# Patient Record
Sex: Male | Born: 1975 | Race: White | Hispanic: No | Marital: Single | Smoking: Current every day smoker
Health system: Southern US, Community
[De-identification: ages and names within clinical notes are randomized; demographics above are authoritative.]

## PROBLEM LIST (undated history)

## (undated) DIAGNOSIS — N200 Calculus of kidney: Secondary | ICD-10-CM

## (undated) HISTORY — PX: CHOLECYSTECTOMY: SHX55

---

## 2016-11-04 ENCOUNTER — Encounter (HOSPITAL_BASED_OUTPATIENT_CLINIC_OR_DEPARTMENT_OTHER): Payer: Self-pay

## 2016-11-04 ENCOUNTER — Emergency Department (HOSPITAL_BASED_OUTPATIENT_CLINIC_OR_DEPARTMENT_OTHER): Payer: Self-pay

## 2016-11-04 ENCOUNTER — Emergency Department (HOSPITAL_BASED_OUTPATIENT_CLINIC_OR_DEPARTMENT_OTHER)
Admission: EM | Admit: 2016-11-04 | Discharge: 2016-11-05 | Disposition: A | Discharge status: Home / Self Care | Payer: Self-pay | Attending: Emergency Medicine | Admitting: Emergency Medicine

## 2016-11-04 DIAGNOSIS — N200 Calculus of kidney: Secondary | ICD-10-CM | POA: Insufficient documentation

## 2016-11-04 DIAGNOSIS — F17210 Nicotine dependence, cigarettes, uncomplicated: Secondary | ICD-10-CM | POA: Insufficient documentation

## 2016-11-04 HISTORY — DX: Calculus of kidney: N20.0

## 2016-11-04 LAB — URINALYSIS, ROUTINE W REFLEX MICROSCOPIC
Bilirubin Urine: NEGATIVE
GLUCOSE, UA: NEGATIVE mg/dL
KETONES UR: NEGATIVE mg/dL
NITRITE: NEGATIVE
PROTEIN: NEGATIVE mg/dL
Specific Gravity, Urine: 1.02 (ref 1.005–1.030)
pH: 6 (ref 5.0–8.0)

## 2016-11-04 LAB — CBC WITH DIFFERENTIAL/PLATELET
BASOS ABS: 0 10*3/uL (ref 0.0–0.1)
BASOS PCT: 0 %
EOS ABS: 0.2 10*3/uL (ref 0.0–0.7)
EOS PCT: 2 %
HEMATOCRIT: 45.9 % (ref 39.0–52.0)
Hemoglobin: 15.7 g/dL (ref 13.0–17.0)
Lymphocytes Relative: 15 %
Lymphs Abs: 1.7 10*3/uL (ref 0.7–4.0)
MCH: 29.1 pg (ref 26.0–34.0)
MCHC: 34.2 g/dL (ref 30.0–36.0)
MCV: 85.2 fL (ref 78.0–100.0)
MONO ABS: 0.9 10*3/uL (ref 0.1–1.0)
MONOS PCT: 8 %
NEUTROS ABS: 8.5 10*3/uL — AB (ref 1.7–7.7)
Neutrophils Relative %: 75 %
PLATELETS: 231 10*3/uL (ref 150–400)
RBC: 5.39 MIL/uL (ref 4.22–5.81)
RDW: 13.7 % (ref 11.5–15.5)
WBC: 11.4 10*3/uL — ABNORMAL HIGH (ref 4.0–10.5)

## 2016-11-04 LAB — URINALYSIS, MICROSCOPIC (REFLEX)

## 2016-11-04 LAB — BASIC METABOLIC PANEL
ANION GAP: 7 (ref 5–15)
BUN: 14 mg/dL (ref 6–20)
CALCIUM: 9 mg/dL (ref 8.9–10.3)
CO2: 25 mmol/L (ref 22–32)
CREATININE: 1.09 mg/dL (ref 0.61–1.24)
Chloride: 106 mmol/L (ref 101–111)
GFR calc Af Amer: >60 mL/min (ref >60)
GLUCOSE: 121 mg/dL — AB (ref 65–99)
Potassium: 4.1 mmol/L (ref 3.5–5.1)
Sodium: 138 mmol/L (ref 135–145)

## 2016-11-04 MED ORDER — KETOROLAC TROMETHAMINE 30 MG/ML IJ SOLN
30.0000 mg | Freq: Once | INTRAMUSCULAR | Status: AC
  Administered 2016-11-04: 30 mg via INTRAVENOUS
  Filled 2016-11-04: qty 1

## 2016-11-04 MED ORDER — SODIUM CHLORIDE 0.9 % IV BOLUS (SEPSIS)
500.0000 mL | Freq: Once | INTRAVENOUS | Status: AC
  Administered 2016-11-04: 500 mL via INTRAVENOUS

## 2016-11-04 MED ORDER — HYDROMORPHONE HCL 1 MG/ML IJ SOLN
1.0000 mg | Freq: Once | INTRAMUSCULAR | Status: AC
  Administered 2016-11-04: 1 mg via INTRAVENOUS
  Filled 2016-11-04: qty 1

## 2016-11-04 MED ORDER — ONDANSETRON HCL 4 MG/2ML IJ SOLN
4.0000 mg | Freq: Once | INTRAMUSCULAR | Status: DC
  Filled 2016-11-04: qty 2

## 2016-11-04 NOTE — ED Triage Notes (Signed)
C/o left flank pain x 2 hours-states feels like kidney stone-grimacing-pacing

## 2016-11-04 NOTE — ED Provider Notes (Signed)
[facility]   CSN: [id number] Arrival date & time: 11/04/16  2139     History   Chief Complaint Chief Complaint  Patient presents with  . Flank Pain    HPI Shannon Simon is a 41 y.o. male who presents with L flank pain. PMH significant for hx of kidney stones. He states that the pain started acutely tonight. It is located over the L flank and is non-radiating. Feels like prior kidney stones. Pain is 10/10. Nothing makes it better or worse. He denies fever, chills, abdominal pain, vomiting, dysuria , hematuria. He has had multiple stents placed by Dr. Pete Glatter in the past for kidney stones.  HPI  Past Medical History:  Diagnosis Date  . Kidney stone     There are no active problems to display for this patient.   Past Surgical History:  Procedure Laterality Date  . CHOLECYSTECTOMY         Home Medications    Prior to Admission medications   Not on File    Family History No family history on file.  Social History Social History  Substance Use Topics  . Smoking status: Current Every Day Smoker  . Smokeless tobacco: Never Used  . Alcohol use No     Allergies   Penicillins   Review of Systems Review of Systems  Constitutional: Negative for fever.  Respiratory: Negative for shortness of breath.   Cardiovascular: Negative for chest pain.  Gastrointestinal: Positive for nausea. Negative for abdominal pain and vomiting.  Genitourinary: Positive for flank pain. Negative for dysuria and hematuria.     Physical Exam Updated Vital Signs BP (!) 152/112 (BP Location: Left Arm)   Pulse 85   Temp 98.2 F (36.8 C) (Oral)   Resp 20   Ht  (1.854 m)   Wt (!) 142.4 kg (314 lb)   SpO2 96%   BMI 41.43 kg/m   Physical Exam  Constitutional: He is oriented to person, place, and time. He appears well-developed and well-nourished. He appears distressed (in pain).  HENT:  Head: Normocephalic and atraumatic.  Eyes: Pupils are  equal, round, and reactive to light. Conjunctivae are normal. Right eye exhibits no discharge. Left eye exhibits no discharge. No scleral icterus.  Neck: Normal range of motion.  Cardiovascular: Normal rate and regular rhythm.  Exam reveals no gallop and no friction rub.   No murmur heard. Pulmonary/Chest: Effort normal and breath sounds normal. No respiratory distress. He has no wheezes. He has no rales. He exhibits no tenderness.  Abdominal: Soft. Bowel sounds are normal. He exhibits no distension and no mass. There is tenderness (L CVA tenderness). There is no rebound and no guarding. No hernia.  Neurological: He is alert and oriented to person, place, and time.  Skin: Skin is warm and dry.  Psychiatric: He has a normal mood and affect. His behavior is normal.  Nursing note and vitals reviewed.    ED Treatments / Results  Labs (all labs ordered are listed, but only abnormal results are displayed) Labs Reviewed  URINALYSIS, ROUTINE W REFLEX MICROSCOPIC - Abnormal; Notable for the following:       Result Value   Hgb urine dipstick LARGE (*)    Leukocytes, UA TRACE (*)    All other components within normal limits  URINALYSIS, MICROSCOPIC (REFLEX) - Abnormal; Notable for the following:    Bacteria, UA RARE (*)    Squamous Epithelial / LPF 0-5 (*)    All other components within  normal limits  BASIC METABOLIC PANEL - Abnormal; Notable for the following:    Glucose, Bld 121 (*)    All other components within normal limits  CBC WITH DIFFERENTIAL/PLATELET - Abnormal; Notable for the following:    WBC 11.4 (*)    Neutro Abs 8.5 (*)    All other components within normal limits    EKG  EKG Interpretation None       Radiology Dg Abdomen 1 View  Result Date: 11/04/2016 CLINICAL DATA:  LEFT flank pain. History of recurrent kidney stones. EXAM: ABDOMEN - 1 VIEW COMPARISON:  None. FINDINGS: Approximate 5 mm calcification projecting in LEFT kidney. Punctate density projecting RIGHT  lower kidney. Surgical clips in the included right abdomen compatible with cholecystectomy. Bowel gas pattern is nondilated and nonobstructive. Phleboliths project in the pelvis. Soft tissue planes and included osseous structures are normal. IMPRESSION: LEFT nephrolithiasis. Punctate RIGHT nephrolithiasis versus enteric contents. Electronically Signed   By: Awilda Metro M.D.   On: 11/04/2016 22:54   US Renal  Result Date: 11/04/2016 CLINICAL DATA:  Left flank pain EXAM: RENAL / URINARY TRACT ULTRASOUND COMPLETE COMPARISON:  Radiograph 11/06/2016 FINDINGS: Right Kidney: Length: 13 cm. Echogenicity within normal limits. No mass or hydronephrosis visualized. Possible punctate 3 mm stone in the upper pole. Left Kidney: Length: 13.7 cm. Echogenicity within normal limits. No mass visualized. Moderate left hydronephrosis. Bladder: Nearly empty. IMPRESSION: 1. Moderate left hydronephrosis. CT KUB to assess for ureteral stone is suggested 2. Possible punctate nonobstructing stone in the right kidney Electronically Signed   By: Jasmine Pang M.D.   On: 11/04/2016 23:24    Procedures Procedures (including critical care time)  Medications Ordered in ED Medications  ondansetron (ZOFRAN) injection 4 mg (4 mg Intravenous Not Given 11/05/16 0004)  ketorolac (TORADOL) 30 MG/ML injection 30 mg (30 mg Intravenous Given 11/04/16 2228)  sodium chloride 0.9 % bolus 500 mL (0 mLs Intravenous Stopped 11/04/16 2356)  HYDROmorphone (DILAUDID) injection 1 mg (1 mg Intravenous Given 11/04/16 2227)  HYDROmorphone (DILAUDID) injection 1 mg (1 mg Intravenous Given 11/04/16 2357)     Initial Impression / Assessment and Plan / ED Course  I have reviewed the triage vital signs and the nursing notes.  Pertinent labs & imaging results that were available during my care of the patient were reviewed by me and considered in my medical decision making (see chart for details).  41 year old male with recurrent kidney stones. Vitals  are normal. CBC remarkable for mild leukocytosis. BMP is unremarkable. UA shows large hgb, trace leukocytes, 6-30 RBC. Renal US shows moderate hydronephrosis. KUB shows 5mm left sided kidney stone. After multiple rounds of pain medicine pain is 3/10. Will d/c with Percocet and Flomax and advised f/u with Dr. Pete Glatter.  Final Clinical Impressions(s) / ED Diagnoses   Final diagnoses:  Kidney stone on left side    New Prescriptions New Prescriptions   No medications on file     Bethel Born, Cordelia Poche 11/05/16 1308    Pricilla Loveless, MD 11/05/16 [phone]

## 2016-11-05 MED ORDER — TAMSULOSIN HCL 0.4 MG PO CAPS: 0.4000 mg | ORAL_CAPSULE | Freq: Every day | ORAL | 0 refills | Status: DC

## 2016-11-05 MED ORDER — OXYCODONE-ACETAMINOPHEN 5-325 MG PO TABS: 2.0000 | ORAL_TABLET | ORAL | 0 refills | Status: DC | PRN

## 2016-11-05 MED ORDER — IBUPROFEN 600 MG PO TABS: 600.0000 mg | ORAL_TABLET | Freq: Four times a day (QID) | ORAL | 0 refills | Status: DC | PRN

## 2016-11-05 NOTE — Discharge Instructions (Signed)
Drink plenty of fluids Take narcotic pain medicine as needed. Do not drink alcohol or drive while taking this medicine Ibuprofen  - Take for pain and inflammation. Take with food three times daily Take Zofran for nausea Take Flomax to help stone pass Follow up with Urology Return if symptoms are worsening

## 2016-11-06 ENCOUNTER — Emergency Department (HOSPITAL_BASED_OUTPATIENT_CLINIC_OR_DEPARTMENT_OTHER): Payer: Self-pay

## 2016-11-06 ENCOUNTER — Emergency Department (HOSPITAL_BASED_OUTPATIENT_CLINIC_OR_DEPARTMENT_OTHER)
Admission: EM | Admit: 2016-11-06 | Discharge: 2016-11-07 | Disposition: A | Discharge status: Home / Self Care | Payer: Self-pay | Attending: Emergency Medicine | Admitting: Emergency Medicine

## 2016-11-06 ENCOUNTER — Encounter (HOSPITAL_BASED_OUTPATIENT_CLINIC_OR_DEPARTMENT_OTHER): Payer: Self-pay | Admitting: *Deleted

## 2016-11-06 DIAGNOSIS — Z7289 Other problems related to lifestyle: Secondary | ICD-10-CM | POA: Insufficient documentation

## 2016-11-06 DIAGNOSIS — Z765 Malingerer [conscious simulation]: Secondary | ICD-10-CM

## 2016-11-06 DIAGNOSIS — N23 Unspecified renal colic: Secondary | ICD-10-CM | POA: Insufficient documentation

## 2016-11-06 DIAGNOSIS — F17200 Nicotine dependence, unspecified, uncomplicated: Secondary | ICD-10-CM | POA: Insufficient documentation

## 2016-11-06 LAB — BASIC METABOLIC PANEL
Anion gap: 6 (ref 5–15)
BUN: 20 mg/dL (ref 6–20)
CHLORIDE: 103 mmol/L (ref 101–111)
CO2: 26 mmol/L (ref 22–32)
CREATININE: 1.47 mg/dL — AB (ref 0.61–1.24)
Calcium: 8.8 mg/dL — ABNORMAL LOW (ref 8.9–10.3)
GFR calc Af Amer: >60 mL/min (ref >60)
GFR calc non Af Amer: 58 mL/min — ABNORMAL LOW (ref >60)
Glucose, Bld: 132 mg/dL — ABNORMAL HIGH (ref 65–99)
POTASSIUM: 4.2 mmol/L (ref 3.5–5.1)
SODIUM: 135 mmol/L (ref 135–145)

## 2016-11-06 MED ORDER — ONDANSETRON HCL 4 MG/2ML IJ SOLN
4.0000 mg | Freq: Once | INTRAMUSCULAR | Status: AC
  Administered 2016-11-06: 4 mg via INTRAVENOUS

## 2016-11-06 MED ORDER — ONDANSETRON HCL 4 MG/2ML IJ SOLN
INTRAMUSCULAR | Status: AC
  Filled 2016-11-06: qty 2

## 2016-11-06 MED ORDER — KETOROLAC TROMETHAMINE 30 MG/ML IJ SOLN
15.0000 mg | Freq: Once | INTRAMUSCULAR | Status: AC
  Administered 2016-11-06: 15 mg via INTRAVENOUS
  Filled 2016-11-06: qty 1

## 2016-11-06 NOTE — ED Triage Notes (Addendum)
Recent dx of kidney stones. Returns for L flank pain, guarding movements and respirations. Holding breath. Percocet and ibuprofen not helping. 10/10. Last meds: percocet at 1700, ibuprofen PTA.

## 2016-11-07 MED ORDER — TRAMADOL HCL 50 MG PO TABS: 50.0000 mg | ORAL_TABLET | Freq: Four times a day (QID) | ORAL | 0 refills | Status: AC | PRN

## 2016-11-07 MED ORDER — DICLOFENAC SODIUM ER 100 MG PO TB24: 100.0000 mg | ORAL_TABLET | Freq: Every day | ORAL | 0 refills | Status: AC

## 2016-11-07 MED ORDER — TAMSULOSIN HCL 0.4 MG PO CAPS: 0.4000 mg | ORAL_CAPSULE | Freq: Every day | ORAL | 0 refills | Status: AC

## 2016-11-07 MED ORDER — FENTANYL CITRATE (PF) 100 MCG/2ML IJ SOLN
100.0000 ug | Freq: Once | INTRAMUSCULAR | Status: DC
  Filled 2016-11-07: qty 2

## 2016-11-07 MED ORDER — SODIUM CHLORIDE 0.9 % IV BOLUS (SEPSIS)
500.0000 mL | Freq: Once | INTRAVENOUS | Status: AC
  Administered 2016-11-07: 500 mL via INTRAVENOUS

## 2016-11-07 MED ORDER — OXYCODONE-ACETAMINOPHEN 5-325 MG PO TABS: 2.0000 | ORAL_TABLET | ORAL | 0 refills | Status: DC | PRN

## 2016-11-07 MED ORDER — TAMSULOSIN HCL 0.4 MG PO CAPS
0.4000 mg | ORAL_CAPSULE | Freq: Every day | ORAL | Status: DC
  Administered 2016-11-07: 0.4 mg via ORAL
  Filled 2016-11-07: qty 1

## 2016-11-07 MED ORDER — TRAMADOL HCL 50 MG PO TABS: 50.0000 mg | ORAL_TABLET | Freq: Four times a day (QID) | ORAL | 0 refills | Status: DC | PRN

## 2016-11-07 MED ORDER — ONDANSETRON 8 MG PO TBDP: ORAL_TABLET | ORAL | 0 refills | Status: AC

## 2016-11-07 NOTE — ED Provider Notes (Signed)
[facility]   CSN: [id number] Arrival date & time: 11/06/16  2239     History   Chief Complaint Chief Complaint  Patient presents with  . Flank Pain    HPI Shannon Simon is a 41 y.o. male.  The history is provided by the patient.  Flank Pain  This is a recurrent problem. The current episode started more than 2 days ago. The problem occurs constantly. The problem has not changed since onset.Pertinent negatives include no chest pain, no headaches and no shortness of breath. Nothing aggravates the symptoms. Nothing relieves the symptoms. Treatments tried: the 15 percocet he was given at discharge, he has not taken the ibuprofen or the flomax. The treatment provided no relief.  Reports he did not take the ibuprofen but has already taken all the percocet. He states it has made no difference in the pain.    Past Medical History:  Diagnosis Date  . Kidney stone     There are no active problems to display for this patient.   Past Surgical History:  Procedure Laterality Date  . CHOLECYSTECTOMY         Home Medications    Prior to Admission medications   Medication Sig Start Date End Date Taking? Authorizing Provider  ibuprofen (ADVIL,MOTRIN) 600 MG tablet Take 1 tablet (600 mg total) by mouth every 6 (six) hours as needed. 11/05/16   Bethel Born, PA-C  oxyCODONE-acetaminophen (PERCOCET/ROXICET) 5-325 MG tablet Take 2 tablets by mouth every 4 (four) hours as needed for severe pain. 11/05/16   Bethel Born, PA-C  tamsulosin (FLOMAX) 0.4 MG CAPS capsule Take 1 capsule (0.4 mg total) by mouth daily. 11/05/16   Bethel Born, PA-C    Family History History reviewed. No pertinent family history.  Social History Social History  Substance Use Topics  . Smoking status: Current Every Day Smoker  . Smokeless tobacco: Never Used  . Alcohol use Yes     Allergies   Penicillins   Review of Systems Review of Systems  Respiratory:  Negative for shortness of breath.   Cardiovascular: Negative for chest pain.  Gastrointestinal: Positive for nausea. Negative for abdominal distention, diarrhea and vomiting.  Genitourinary: Positive for flank pain. Negative for hematuria.  Neurological: Negative for headaches.  All other systems reviewed and are negative.    Physical Exam Updated Vital Signs BP 134/89   Pulse 84   Temp 98.1 F (36.7 C) (Oral)   Resp 20   Ht  (1.854 m)   Wt (!) 142.4 kg (314 lb)   SpO2 98%   BMI 41.43 kg/m   Physical Exam  Constitutional: He is oriented to person, place, and time. He appears well-developed and well-nourished. No distress.  Is somnolent, but arouses easily with verbal stimuli.  Pinpoint pupils.    HENT:  Head: Normocephalic and atraumatic.  Mouth/Throat: No oropharyngeal exudate.  Eyes: Conjunctivae are normal.  Neck: Normal range of motion. Neck supple.  Cardiovascular: Normal rate, regular rhythm, normal heart sounds and intact distal pulses.   Pulmonary/Chest: Effort normal and breath sounds normal. He has no wheezes. He has no rales.  Abdominal: Soft. Bowel sounds are normal. He exhibits no mass. There is no tenderness. There is no rebound and no guarding.  Musculoskeletal: Normal range of motion.  Neurological: He is alert and oriented to person, place, and time. He displays normal reflexes.  Skin: Skin is warm and dry. Capillary refill takes less than 2 seconds.  Psychiatric:  He has a normal mood and affect.     ED Treatments / Results  Labs (all labs ordered are listed, but only abnormal results are displayed)  Results for orders placed or performed during the hospital encounter of 11/06/16  Basic metabolic panel  Result Value Ref Range   Sodium 135 135 - 145 mmol/L   Potassium 4.2 3.5 - 5.1 mmol/L   Chloride 103 101 - 111 mmol/L   CO2 26 22 - 32 mmol/L   Glucose, Bld 132 (H) 65 - 99 mg/dL   BUN 20 6 - 20 mg/dL   Creatinine, Ser 1.61 (H) 0.61 - 1.24  mg/dL   Calcium 8.8 (L) 8.9 - 10.3 mg/dL   GFR calc non Af Amer 58 (L) >60 mL/min   GFR calc Af Amer >60 >60 mL/min   Anion gap 6 5 - 15   Dg Abdomen 1 View  Result Date: 11/04/2016 CLINICAL DATA:  LEFT flank pain. History of recurrent kidney stones. EXAM: ABDOMEN - 1 VIEW COMPARISON:  None. FINDINGS: Approximate 5 mm calcification projecting in LEFT kidney. Punctate density projecting RIGHT lower kidney. Surgical clips in the included right abdomen compatible with cholecystectomy. Bowel gas pattern is nondilated and nonobstructive. Phleboliths project in the pelvis. Soft tissue planes and included osseous structures are normal. IMPRESSION: LEFT nephrolithiasis. Punctate RIGHT nephrolithiasis versus enteric contents. Electronically Signed   By: Awilda Metro M.D.   On: 11/04/2016 22:54   US Renal  Result Date: 11/04/2016 CLINICAL DATA:  Left flank pain EXAM: RENAL / URINARY TRACT ULTRASOUND COMPLETE COMPARISON:  Radiograph 11/06/2016 FINDINGS: Right Kidney: Length: 13 cm. Echogenicity within normal limits. No mass or hydronephrosis visualized. Possible punctate 3 mm stone in the upper pole. Left Kidney: Length: 13.7 cm. Echogenicity within normal limits. No mass visualized. Moderate left hydronephrosis. Bladder: Nearly empty. IMPRESSION: 1. Moderate left hydronephrosis. CT KUB to assess for ureteral stone is suggested 2. Possible punctate nonobstructing stone in the right kidney Electronically Signed   By: Jasmine Pang M.D.   On: 11/04/2016 23:24   Ct Renal Stone Study  Result Date: 11/07/2016 CLINICAL DATA:  41 y/o M; 3 days of left flank pain. History of kidney stones. EXAM: CT ABDOMEN AND PELVIS WITHOUT CONTRAST TECHNIQUE: Multidetector CT imaging of the abdomen and pelvis was performed following the standard protocol without IV contrast. COMPARISON:  None. FINDINGS: Lower chest: No acute abnormality. Hepatobiliary: No focal liver abnormality is seen. Status post cholecystectomy. No biliary  dilatation. Pancreas: Unremarkable. No pancreatic ductal dilatation or surrounding inflammatory changes. Spleen: Normal in size without focal abnormality. Adrenals/Urinary Tract: Bilateral kidney nephrolithiasis the largest stone in the right lower pole measuring 8 mm. Normal adrenal glands. No right hydronephrosis. Normal bladder. Moderate left kidney hydronephrosis and perinephric stranding secondary to a 5 x 5 x 7 mm stone (AP x ML x CC series 2, image 67 and series 4, image 75) in the left distal ureter at the level of the left upper sacrum. Stomach/Bowel: Stomach is within normal limits. Appendix appears normal. No evidence of bowel wall thickening, distention, or inflammatory changes. Vascular/Lymphatic: No significant vascular findings are present. No enlarged abdominal or pelvic lymph nodes. Reproductive: Central prostatic calcification. Other: No abdominal wall hernia or abnormality. No abdominopelvic ascites. Musculoskeletal: No acute or significant osseous findings. IMPRESSION: 1. Moderate left hydronephrosis and perinephric stranding secondary to ureter stone measuring up to 7 mm in the left distal ureter at level of left upper sacrum. 2. Bilateral kidney nonobstructive nephrolithiasis. Electronically Signed  By: Mitzi Hansen M.D.   On: 11/07/2016 00:02    Radiology Ct Renal Stone Study  Result Date: 11/07/2016 CLINICAL DATA:  41 y/o M; 3 days of left flank pain. History of kidney stones. EXAM: CT ABDOMEN AND PELVIS WITHOUT CONTRAST TECHNIQUE: Multidetector CT imaging of the abdomen and pelvis was performed following the standard protocol without IV contrast. COMPARISON:  None. FINDINGS: Lower chest: No acute abnormality. Hepatobiliary: No focal liver abnormality is seen. Status post cholecystectomy. No biliary dilatation. Pancreas: Unremarkable. No pancreatic ductal dilatation or surrounding inflammatory changes. Spleen: Normal in size without focal abnormality. Adrenals/Urinary  Tract: Bilateral kidney nephrolithiasis the largest stone in the right lower pole measuring 8 mm. Normal adrenal glands. No right hydronephrosis. Normal bladder. Moderate left kidney hydronephrosis and perinephric stranding secondary to a 5 x 5 x 7 mm stone (AP x ML x CC series 2, image 67 and series 4, image 75) in the left distal ureter at the level of the left upper sacrum. Stomach/Bowel: Stomach is within normal limits. Appendix appears normal. No evidence of bowel wall thickening, distention, or inflammatory changes. Vascular/Lymphatic: No significant vascular findings are present. No enlarged abdominal or pelvic lymph nodes. Reproductive: Central prostatic calcification. Other: No abdominal wall hernia or abnormality. No abdominopelvic ascites. Musculoskeletal: No acute or significant osseous findings. IMPRESSION: 1. Moderate left hydronephrosis and perinephric stranding secondary to ureter stone measuring up to 7 mm in the left distal ureter at level of left upper sacrum. 2. Bilateral kidney nonobstructive nephrolithiasis. Electronically Signed   By: Mitzi Hansen M.D.   On: 11/07/2016 00:02    Procedures Procedures (including critical care time)  Medications Ordered in ED Medications  fentaNYL (SUBLIMAZE) injection 100 mcg (not administered)  tamsulosin (FLOMAX) capsule 0.4 mg (0.4 mg Oral Given 11/07/16 0013)  ketorolac (TORADOL) 30 MG/ML injection 15 mg (15 mg Intravenous Given 11/06/16 2331)  ondansetron (ZOFRAN) injection 4 mg (4 mg Intravenous Given 11/06/16 2331)  sodium chloride 0.9 % bolus 500 mL (500 mLs Intravenous New Bag/Given 11/07/16 0015)     Patient was somnolent on arrival and had pinpoint pupils, states he took all 15 of the percocet he received on 11/05/16. States percocet is not helping his pain.  EDP immediately told the patient he seemed somnolent and could not drive with narcotic medication on board.  EDP asked how the patient came to the ED, he said he was  dropped off by a friend and they could come get him.  The patient reportedly told the nurse, crystal, he was dropped off by his kids and could not wake them. EDP ordered the pain medication as the writer was unaware of this.  Nurse discontinued it as the patient stated he would not even call him ride until d/c.  Patient lied and got into the driver's seat of a vehicle and drove away.  This was confirmed by security.    Final Clinical Impressions(s) / ED Diagnoses   Shortness of breath, swelling or the lips or tongue, chest pain, dyspnea on exertion, new weakness or numbness changes in vision or speech,  Inability to tolerate liquids or food, changes in voice cough, altered mental status or any concerns. No signs of systemic illness or infection. The patient is nontoxic-appearing on exam and vital signs are within normal limits.    I have reviewed the triage vital signs and the nursing notes. Pertinent labs &imaging results that were available during my care of the patient were reviewed by me and considered in my  medical decision making (see chart for details).  After history, exam, and medical workup I feel the patient has been appropriately medically screened and is safe for discharge home. Pertinent diagnoses were discussed with the patient. Patient was given return precautions.       Feleica Fulmore, MD 11/07/16 [phone]

## 2016-11-07 NOTE — ED Notes (Signed)
Pt discharged to home with family. NAD.  

## 2017-01-20 ENCOUNTER — Encounter (HOSPITAL_BASED_OUTPATIENT_CLINIC_OR_DEPARTMENT_OTHER): Payer: Self-pay | Admitting: *Deleted

## 2017-01-20 ENCOUNTER — Emergency Department (HOSPITAL_BASED_OUTPATIENT_CLINIC_OR_DEPARTMENT_OTHER): Payer: Self-pay

## 2017-01-20 ENCOUNTER — Emergency Department (HOSPITAL_BASED_OUTPATIENT_CLINIC_OR_DEPARTMENT_OTHER)
Admission: EM | Admit: 2017-01-20 | Discharge: 2017-01-20 | Disposition: A | Discharge status: Home / Self Care | Payer: Self-pay | Attending: Emergency Medicine | Admitting: Emergency Medicine

## 2017-01-20 ENCOUNTER — Other Ambulatory Visit: Payer: Self-pay

## 2017-01-20 DIAGNOSIS — Z87442 Personal history of urinary calculi: Secondary | ICD-10-CM | POA: Insufficient documentation

## 2017-01-20 DIAGNOSIS — N200 Calculus of kidney: Secondary | ICD-10-CM | POA: Insufficient documentation

## 2017-01-20 DIAGNOSIS — R110 Nausea: Secondary | ICD-10-CM | POA: Insufficient documentation

## 2017-01-20 DIAGNOSIS — Z79899 Other long term (current) drug therapy: Secondary | ICD-10-CM | POA: Insufficient documentation

## 2017-01-20 DIAGNOSIS — F17210 Nicotine dependence, cigarettes, uncomplicated: Secondary | ICD-10-CM | POA: Insufficient documentation

## 2017-01-20 LAB — CBC
HCT: 45.5 % (ref 39.0–52.0)
HEMOGLOBIN: 15.4 g/dL (ref 13.0–17.0)
MCH: 28.9 pg (ref 26.0–34.0)
MCHC: 33.8 g/dL (ref 30.0–36.0)
MCV: 85.5 fL (ref 78.0–100.0)
Platelets: 246 10*3/uL (ref 150–400)
RBC: 5.32 MIL/uL (ref 4.22–5.81)
RDW: 13.8 % (ref 11.5–15.5)
WBC: 13.9 10*3/uL — AB (ref 4.0–10.5)

## 2017-01-20 LAB — URINALYSIS, MICROSCOPIC (REFLEX): WBC UA: NONE SEEN WBC/hpf (ref 0–5)

## 2017-01-20 LAB — BASIC METABOLIC PANEL
ANION GAP: 7 (ref 5–15)
BUN: 20 mg/dL (ref 6–20)
CHLORIDE: 105 mmol/L (ref 101–111)
CO2: 25 mmol/L (ref 22–32)
Calcium: 8.8 mg/dL — ABNORMAL LOW (ref 8.9–10.3)
Creatinine, Ser: 1.27 mg/dL — ABNORMAL HIGH (ref 0.61–1.24)
GFR calc Af Amer: >60 mL/min (ref >60)
Glucose, Bld: 129 mg/dL — ABNORMAL HIGH (ref 65–99)
POTASSIUM: 4.1 mmol/L (ref 3.5–5.1)
SODIUM: 137 mmol/L (ref 135–145)

## 2017-01-20 LAB — URINALYSIS, ROUTINE W REFLEX MICROSCOPIC
BILIRUBIN URINE: NEGATIVE
Glucose, UA: NEGATIVE mg/dL
KETONES UR: NEGATIVE mg/dL
Leukocytes, UA: NEGATIVE
NITRITE: NEGATIVE
PH: 8 (ref 5.0–8.0)
PROTEIN: NEGATIVE mg/dL
Specific Gravity, Urine: 1.015 (ref 1.005–1.030)

## 2017-01-20 LAB — RAPID URINE DRUG SCREEN, HOSP PERFORMED
Amphetamines: POSITIVE — AB
Barbiturates: NOT DETECTED
Benzodiazepines: NOT DETECTED
COCAINE: NOT DETECTED
Opiates: NOT DETECTED
TETRAHYDROCANNABINOL: NOT DETECTED

## 2017-01-20 MED ORDER — OXYCODONE-ACETAMINOPHEN 5-325 MG PO TABS
1.0000 | ORAL_TABLET | Freq: Once | ORAL | Status: AC
  Administered 2017-01-20: 1 via ORAL
  Filled 2017-01-20: qty 1

## 2017-01-20 MED ORDER — OXYCODONE-ACETAMINOPHEN 5-325 MG PO TABS: 1.0000 | ORAL_TABLET | Freq: Four times a day (QID) | ORAL | 0 refills | Status: AC | PRN

## 2017-01-20 MED ORDER — MORPHINE SULFATE (PF) 4 MG/ML IV SOLN
4.0000 mg | Freq: Once | INTRAVENOUS | Status: AC
  Administered 2017-01-20: 4 mg via INTRAVENOUS
  Filled 2017-01-20: qty 1

## 2017-01-20 MED ORDER — IBUPROFEN 800 MG PO TABS: 800.0000 mg | ORAL_TABLET | Freq: Three times a day (TID) | ORAL | 0 refills | Status: AC

## 2017-01-20 MED ORDER — SODIUM CHLORIDE 0.9 % IV BOLUS (SEPSIS)
1000.0000 mL | Freq: Once | INTRAVENOUS | Status: AC
  Administered 2017-01-20: 1000 mL via INTRAVENOUS

## 2017-01-20 MED ORDER — MORPHINE SULFATE (PF) 4 MG/ML IV SOLN
4.0000 mg | Freq: Once | INTRAVENOUS | Status: DC
  Filled 2017-01-20: qty 1

## 2017-01-20 MED ORDER — ONDANSETRON HCL 4 MG PO TABS: 4.0000 mg | ORAL_TABLET | Freq: Four times a day (QID) | ORAL | 0 refills | Status: AC

## 2017-01-20 MED ORDER — FENTANYL CITRATE (PF) 100 MCG/2ML IJ SOLN
50.0000 ug | INTRAMUSCULAR | Status: DC | PRN
  Administered 2017-01-20: 50 ug via INTRAVENOUS
  Filled 2017-01-20: qty 2

## 2017-01-20 MED ORDER — ONDANSETRON HCL 4 MG/2ML IJ SOLN
4.0000 mg | Freq: Once | INTRAMUSCULAR | Status: AC
  Administered 2017-01-20: 4 mg via INTRAVENOUS
  Filled 2017-01-20: qty 2

## 2017-01-20 MED ORDER — KETOROLAC TROMETHAMINE 30 MG/ML IJ SOLN
30.0000 mg | Freq: Once | INTRAMUSCULAR | Status: AC
  Administered 2017-01-20: 30 mg via INTRAVENOUS
  Filled 2017-01-20: qty 1

## 2017-01-20 NOTE — ED Notes (Signed)
Pt is verbally aggressive and does not want his vitals taken. RN aware.

## 2017-01-20 NOTE — ED Provider Notes (Signed)
[facility] EMERGENCY DEPARTMENT Provider Note   CSN: [id number] Arrival date & time: 01/20/17  1724     History   Chief Complaint Chief Complaint  Patient presents with  . Flank Pain    HPI Jaevion Goto is a 41 y.o. male.  HPI  41 y.o. male with a hx of Kidney stones3, presents to the Emergency Department today due to left flank pain. Onset x 8 hours ago. Sudden onset. Radiation into left groin. Associated nausea without emesis. No CP/SOB. No dysuria. No fevers. Rates pain 10/10. Throbbing sensation and isolated to left flank. No chills. No other symptoms noted   Past Medical History:  Diagnosis Date  . Kidney stone     There are no active problems to display for this patient.   Past Surgical History:  Procedure Laterality Date  . CHOLECYSTECTOMY         Home Medications    Prior to Admission medications   Medication Sig Start Date End Date Taking? Authorizing Provider  Diclofenac Sodium CR (VOLTAREN-XR) 100 MG 24 hr tablet Take 1 tablet (100 mg total) by mouth daily. 11/07/16   Palumbo, April, MD  ondansetron (ZOFRAN ODT) 8 MG disintegrating tablet 8mg  ODT q8 hours prn nausea 11/07/16   Palumbo, April, MD  tamsulosin (FLOMAX) 0.4 MG CAPS capsule Take 1 capsule (0.4 mg total) by mouth daily. 11/07/16   Palumbo, April, MD  traMADol (ULTRAM) 50 MG tablet Take 1 tablet (50 mg total) by mouth every 6 (six) hours as needed. 11/07/16   Palumbo, April, MD    Family History History reviewed. No pertinent family history.  Social History Social History   Tobacco Use  . Smoking status: Current Every Day Smoker    Packs/day: 1.00    Types: Cigarettes  . Smokeless tobacco: Never Used  Substance Use Topics  . Alcohol use: Yes  . Drug use: No     Allergies   Penicillins   Review of Systems Review of Systems ROS reviewed and all are negative for acute change except as noted in the HPI.  Physical Exam Updated Vital Signs BP (!) 161/113   Pulse  89   Temp 97.8 F (36.6 C)   Resp 16   Ht 6\' 2"  (1.88 m)   Wt 136.1 kg (300 lb)   SpO2 99%   BMI 38.52 kg/m   Physical Exam  Constitutional: He is oriented to person, place, and time. Vital signs are normal. He appears well-developed and well-nourished. No distress.  HENT:  Head: Normocephalic and atraumatic.  Right Ear: Hearing, tympanic membrane, external ear and ear canal normal.  Left Ear: Hearing, tympanic membrane, external ear and ear canal normal.  Nose: Nose normal.  Mouth/Throat: Uvula is midline, oropharynx is clear and moist and mucous membranes are normal. No trismus in the jaw. No oropharyngeal exudate, posterior oropharyngeal erythema or tonsillar abscesses.  Eyes: Conjunctivae and EOM are normal. Pupils are equal, round, and reactive to light.  Neck: Normal range of motion. Neck supple. No tracheal deviation present.  Cardiovascular: Normal rate, regular rhythm, S1 normal, S2 normal, normal heart sounds, intact distal pulses and normal pulses.  Pulmonary/Chest: Effort normal and breath sounds normal. No respiratory distress. He has no decreased breath sounds. He has no wheezes. He has no rhonchi. He has no rales.  Abdominal: Normal appearance and bowel sounds are normal. There is no tenderness. There is CVA tenderness (left). There is no rigidity, no rebound, no guarding, no tenderness at McBurney's point and  negative Murphy's sign.  Musculoskeletal: Normal range of motion.  Neurological: He is alert and oriented to person, place, and time.  Skin: Skin is warm and dry.  Psychiatric: He has a normal mood and affect. His speech is normal and behavior is normal. Thought content normal.  Nursing note and vitals reviewed.  ED Treatments / Results  Labs (all labs ordered are listed, but only abnormal results are displayed) Labs Reviewed  URINALYSIS, ROUTINE W REFLEX MICROSCOPIC - Abnormal; Notable for the following components:      Result Value   APPearance CLOUDY (*)     Hgb urine dipstick SMALL (*)    All other components within normal limits  RAPID URINE DRUG SCREEN, HOSP PERFORMED - Abnormal; Notable for the following components:   Amphetamines POSITIVE (*)    All other components within normal limits  URINALYSIS, MICROSCOPIC (REFLEX) - Abnormal; Notable for the following components:   Bacteria, UA FEW (*)    Squamous Epithelial / LPF 0-5 (*)    All other components within normal limits  CBC - Abnormal; Notable for the following components:   WBC 13.9 (*)    All other components within normal limits  BASIC METABOLIC PANEL - Abnormal; Notable for the following components:   Glucose, Bld 129 (*)    Creatinine, Ser 1.27 (*)    Calcium 8.8 (*)    All other components within normal limits    EKG  EKG Interpretation None       Radiology Ct Renal Stone Study  Result Date: 01/20/2017 CLINICAL DATA:  Left flank pain with history of bilateral renal calculi. EXAM: CT ABDOMEN AND PELVIS WITHOUT CONTRAST TECHNIQUE: Multidetector CT imaging of the abdomen and pelvis was performed following the standard protocol without IV contrast. COMPARISON:  11/07/2016 as well as multiple prior CT studies. FINDINGS: Lower chest: No acute abnormality. Hepatobiliary: No focal liver abnormality is seen. Status post cholecystectomy. No biliary dilatation. Pancreas: Unremarkable. No pancreatic ductal dilatation or surrounding inflammatory changes. Spleen: Normal in size without focal abnormality. Adrenals/Urinary Tract: Moderately severe left-sided hydronephrosis and hydroureter. Calculus in the distal ureter roughly at the level of S1-2 measures approximately 7 mm in greatest diameter. Some additional punctate calculi in the lower left kidney. Cluster of likely more than 1 calculus in the lower pole collecting system of the right kidney measures approximately 9 mm in height and is not causing obstruction. The bladder is unremarkable. Stomach/Bowel: Bowel shows no evidence of  obstruction. Some high density material is identified throughout much of the appendix which is not inflamed. No free air. Vascular/Lymphatic: No significant vascular findings are present. No enlarged abdominal or pelvic lymph nodes. Reproductive: Prostate is unremarkable. Other: Stable small umbilical hernia containing fat. Musculoskeletal: No acute or significant osseous findings. IMPRESSION: Moderately severe left hydronephrosis and hydroureter secondary to a 7 mm calculus located in the distal ureter at roughly the S1-2 level. Electronically Signed   By: Irish LackGlenn  Yamagata M.D.   On: 01/20/2017 20:44   Procedures Procedures (including critical care time)  Medications Ordered in ED Medications  fentaNYL (SUBLIMAZE) injection 50 mcg (50 mcg Intravenous Given 01/20/17 1836)  morphine 4 MG/ML injection 4 mg (4 mg Intravenous Not Given 01/20/17 2133)  oxyCODONE-acetaminophen (PERCOCET/ROXICET) 5-325 MG per tablet 1 tablet (not administered)  ondansetron (ZOFRAN) injection 4 mg (4 mg Intravenous Given 01/20/17 1836)  ondansetron (ZOFRAN) injection 4 mg (4 mg Intravenous Given 01/20/17 1959)  ketorolac (TORADOL) 30 MG/ML injection 30 mg (30 mg Intravenous Given 01/20/17 1959)  morphine 4 MG/ML injection 4 mg (4 mg Intravenous Given 01/20/17 1959)  sodium chloride 0.9 % bolus 1,000 mL (0 mLs Intravenous Stopped 01/20/17 2046)   Initial Impression / Assessment and Plan / ED Course  I have reviewed the triage vital signs and the nursing notes.  Pertinent labs & imaging results that were available during my care of the patient were reviewed by me and considered in my medical decision making (see chart for details).  Final Clinical Impressions(s) / ED Diagnoses  {I have reviewed and evaluated the relevant laboratory values. {I have reviewed and evaluated the relevant imaging studies.  {I have reviewed the relevant previous healthcare records.  {I obtained HPI from historian.   ED Course:  Assessment:  Pt has been diagnosed with a Kidney Stone via CT around 7mm. Mod-severe hydro. Serum creatine WNL, vitals sign stable and the pt does not have irratractable vomiting. UA unremarkable. Consult to Urology (Dr. Al CorpusNarang) recommended DC with follow up in office. Pt will be dc home with pain medications & has been advised to follow up with Urology. I have reviewed the [place] Controlled Substance Reporting System. Given Rx percocet.   Disposition/Plan:  DC Home Additional Verbal discharge instructions given and discussed with patient.  Pt Instructed to f/u with Urology in the next week for evaluation and treatment of symptoms. Return precautions given Pt acknowledges and agrees with plan  Supervising Physician Arby BarrettePfeiffer, Marcy, MD  Final diagnoses:  Nephrolithiasis    ED Discharge Orders    None         Audry PiliMohr, Vitoria Conyer, PA-C 01/20/17 2318    Arby BarrettePfeiffer, Marcy, MD 01/22/17 [id number]

## 2017-01-20 NOTE — ED Notes (Signed)
Pt wanting pain medication to take home with him. Explained to pt that it was illegal for us to dispense medication to go. Pt instructed to take prescription to 24 hour pharmacy. Pt states "I'm not asking you, I want to ask the doctor." Explained to pt that he had been discharged and it was time for him to leave department. Security standing by in case of need.

## 2017-01-20 NOTE — ED Notes (Addendum)
Pt called out asking when he would be seen, this RN entered the room and pt began cursing and stated "this is the longest I have ever had to wait, I am going to walk out this MF and go somewhere that will actually help me." This RN informed him the doctor was aware of his status and he would be seen shortly. Pt stated, "I will wait until 2000 and then I am leaving".

## 2017-01-20 NOTE — Discharge Instructions (Signed)
Please read and follow all provided instructions.  Your diagnoses today include:  1. Nephrolithiasis     Tests performed today include: Urine test that showed blood in your urine and no infection CT scan which showed a 7 millimeter kidney on the left side Blood test that showed normal kidney function Vital signs. See below for your results today.   Medications prescribed:   Take any prescribed medications only as directed.  Home care instructions:  Follow any educational materials contained in this packet.  Please double your fluid intake for the next several days. Strain your urine and save any stones that may pass.   BE VERY CAREFUL not to take multiple medicines containing Tylenol (also called acetaminophen). Doing so can lead to an overdose which can damage your liver and cause liver failure and possibly death.   Follow-up instructions: Please follow-up with your urologist or the urologist referral (provided on front page) in the next 1 week for further evaluation of your symptoms.  If you need to return to the Emergency Department, go to [facility] and not [facility]. The [facility]rologists are located at [facility] and can better care for you at this location.  Return instructions:  If you need to return to the Emergency Department, [facility] and [facility]. The urologists are located at [facility] and can better care for you at this location.  Please return to the Emergency Department if you experience worsening symptoms.  Please return if you develop fever or uncontrolled pain or vomiting. Please return if you have any other emergent concerns.  Additional Information:  Your vital signs today were: BP 133/79 (BP Location: Left Arm)    Pulse 85    Temp 97.8 F (36.6 C)    Resp 16    Ht 6\' 2"  (1.88 m)    Wt 136.1 kg (300 lb)    SpO2 96%    BMI 38.52 kg/m  If your blood pressure (BP) was elevated above 135/85 this visit, please  have this repeated by your doctor within one month. --------------

## 2017-01-20 NOTE — ED Triage Notes (Addendum)
Pt c/o left flank pain x 8 hrs hx stones, pt cursing at triage nurse , refusing temp and bp to be done.jerks thermometer out of his mouth Pt cursing at visitor thats with him.

## 2017-01-20 NOTE — ED Notes (Signed)
Patient transported to CT 

## 2018-02-26 IMAGING — DX DG ABDOMEN 1V
2 series · 2 of 2 positions shown · non-contrast
Comparison: None.

CLINICAL DATA: LEFT flank pain. History of recurrent kidney stones.

EXAM:
ABDOMEN - 1 VIEW

[abdomen kub (1 of 2)]
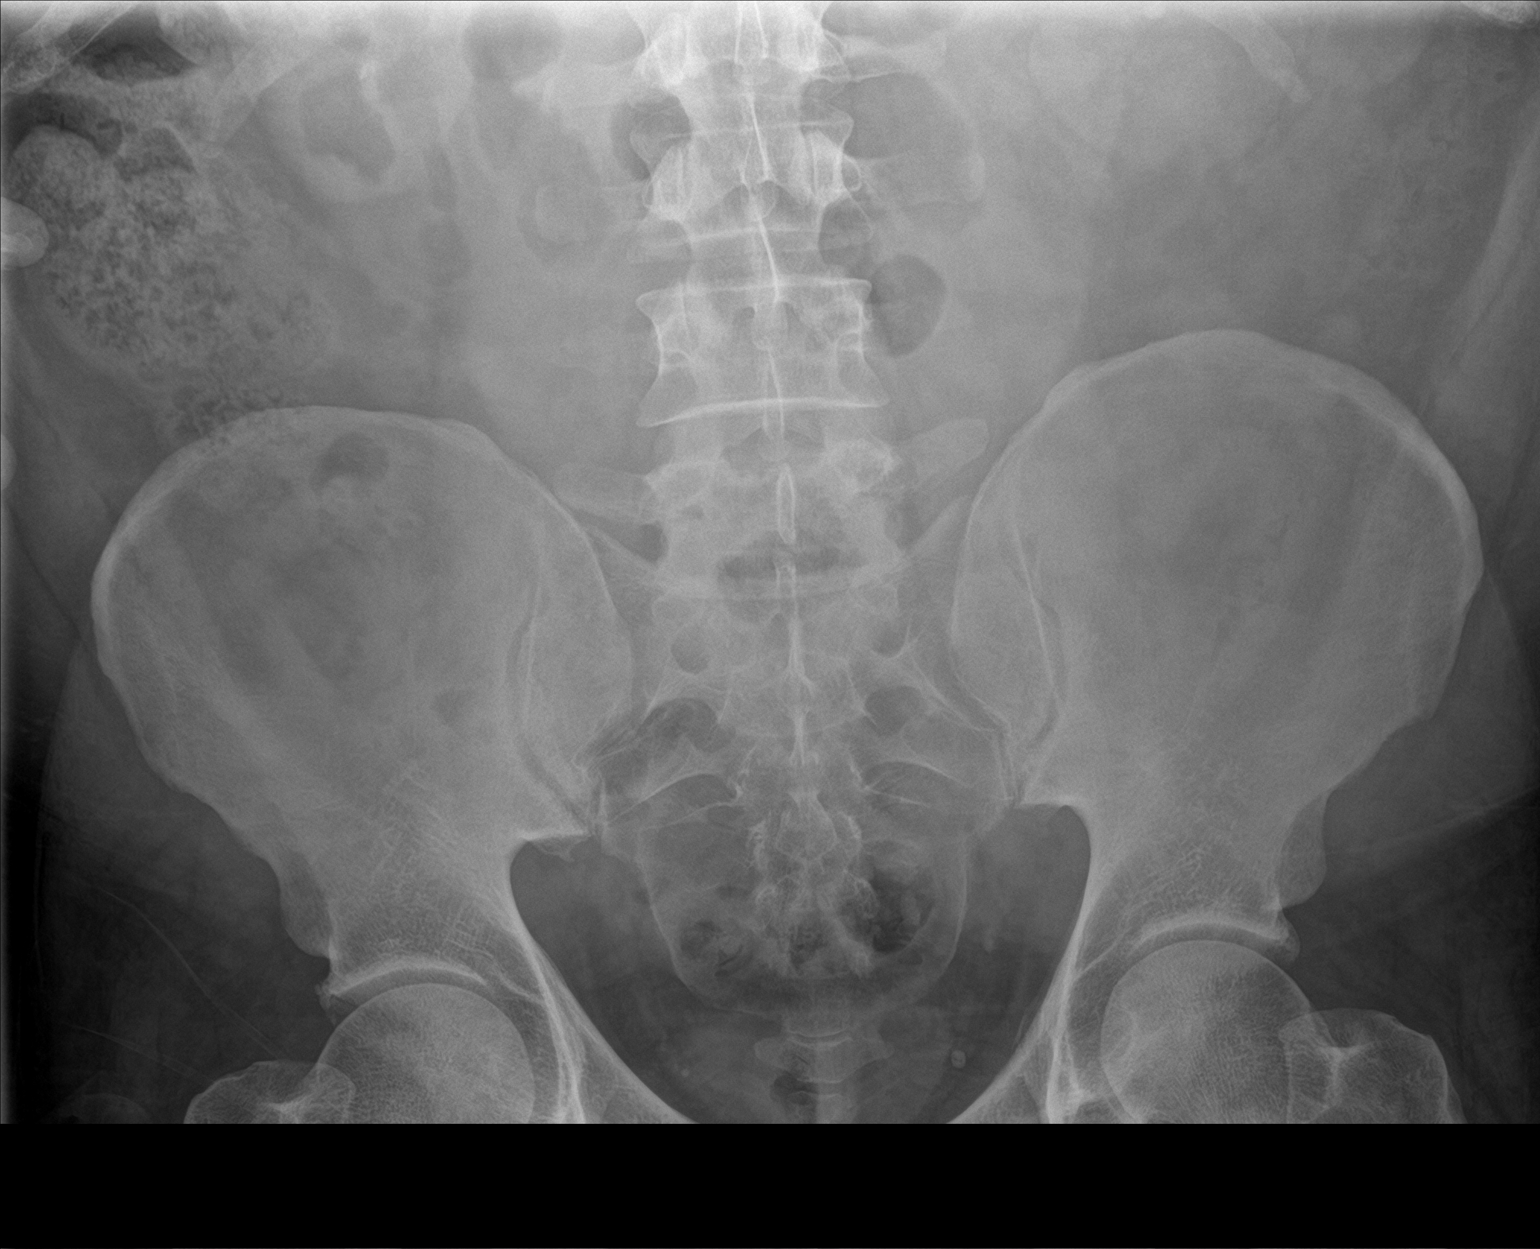

[abdomen kub (2 of 2)]
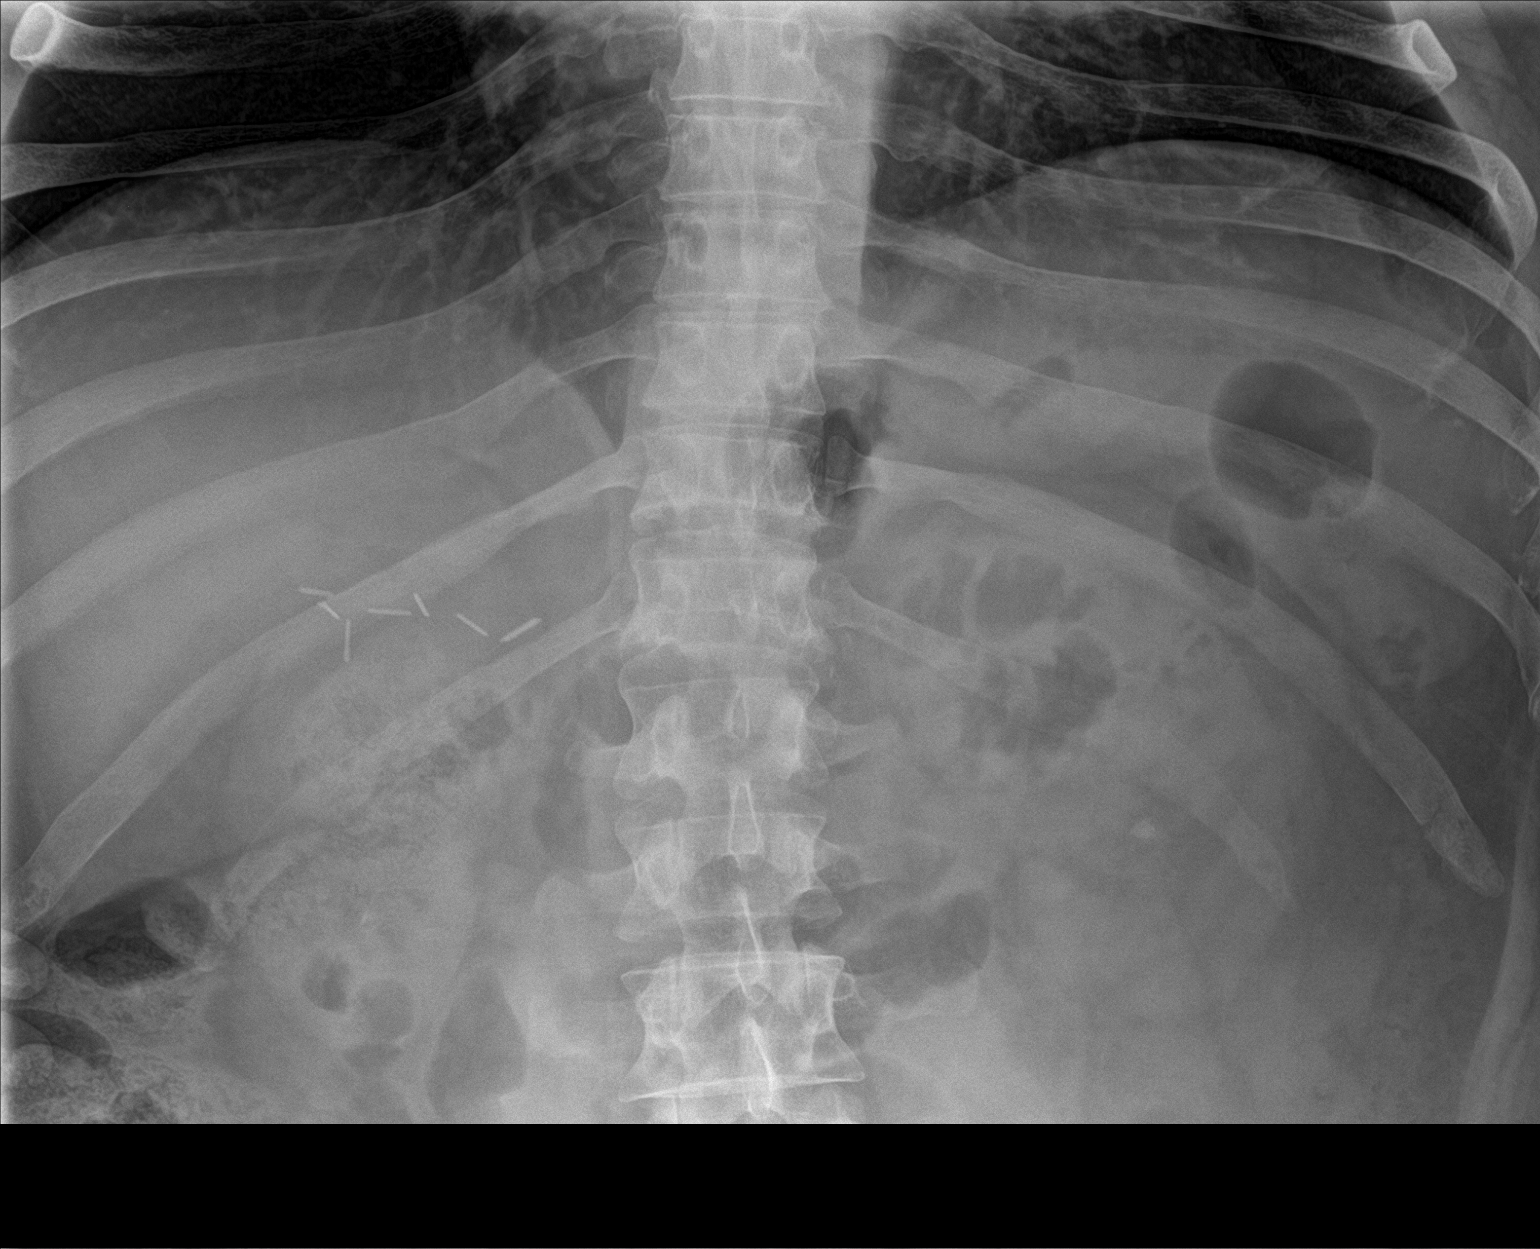

[2 of 2 positions shown; findings below may reference images not displayed]

FINDINGS: Approximate 5 mm calcification projecting in LEFT kidney. Punctate
density projecting RIGHT lower kidney. Surgical clips in the
included right abdomen compatible with cholecystectomy. Bowel gas
pattern is nondilated and nonobstructive. Phleboliths project in the
pelvis. Soft tissue planes and included osseous structures are
normal.
IMPRESSION: LEFT nephrolithiasis. Punctate RIGHT nephrolithiasis versus enteric
contents.

## 2018-05-14 IMAGING — CT CT RENAL STONE PROTOCOL
2 of 4 series · 16 of 46 positions shown, 18 images · non-contrast
Comparison: 11/07/2016 as well as multiple prior CT studies.

CLINICAL DATA: Left flank pain with history of bilateral renal
calculi.

EXAM:
CT ABDOMEN AND PELVIS WITHOUT CONTRAST
TECHNIQUE: Multidetector CT imaging of the abdomen and pelvis was performed
following the standard protocol without IV contrast.

[Series 2: axial st · axial · 0.98mm/px · z∈[-767,-282]mm · 13 of 107 slices shown, 15 images]
[im 5/107  soft-tissue]
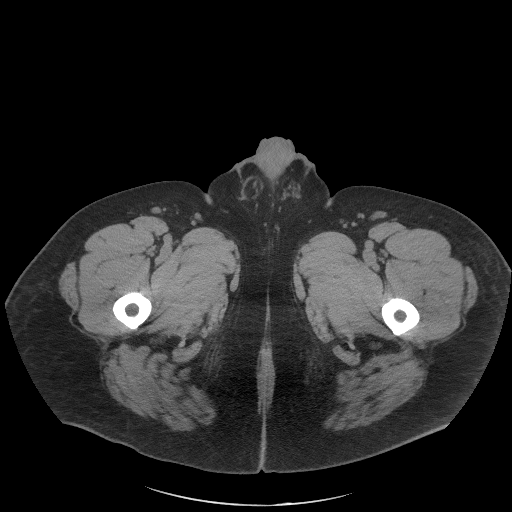
[im 5/107  bone]
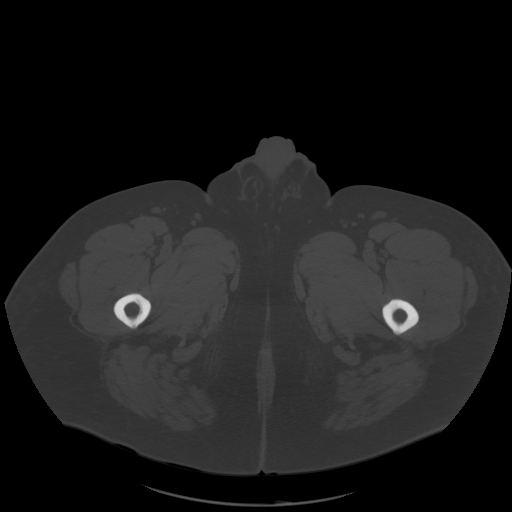
[im 14/107  soft-tissue]
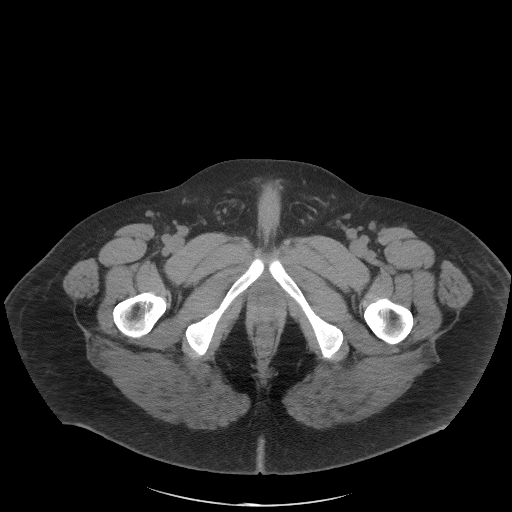
[im 23/107  soft-tissue]
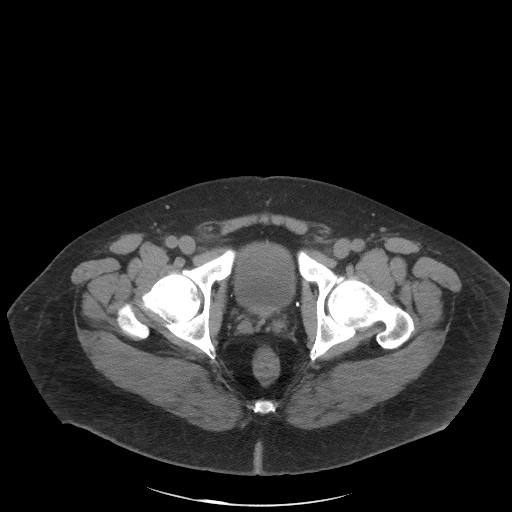
[im 31/107  soft-tissue]
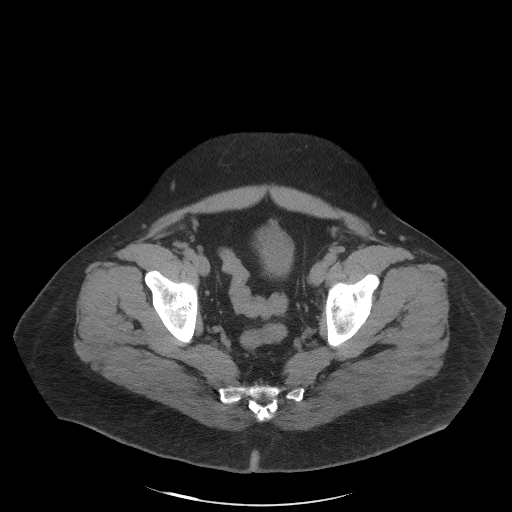
[im 36/107  soft-tissue]
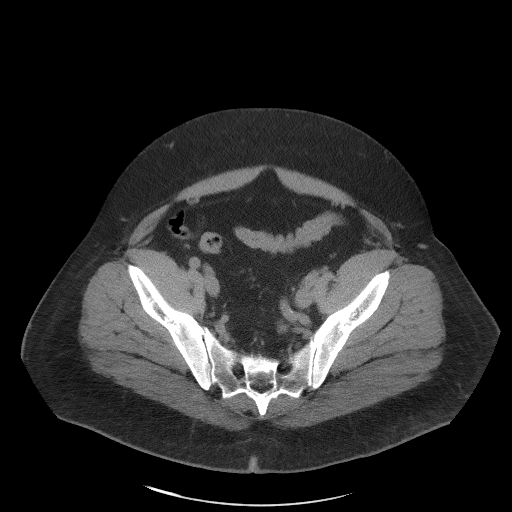
[im 45/107  soft-tissue]
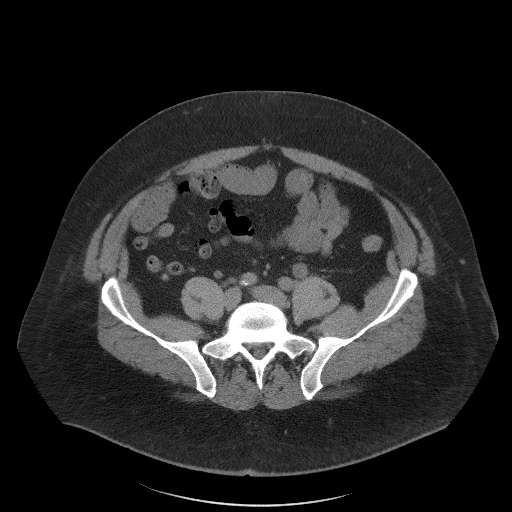
[im 54/107  soft-tissue]
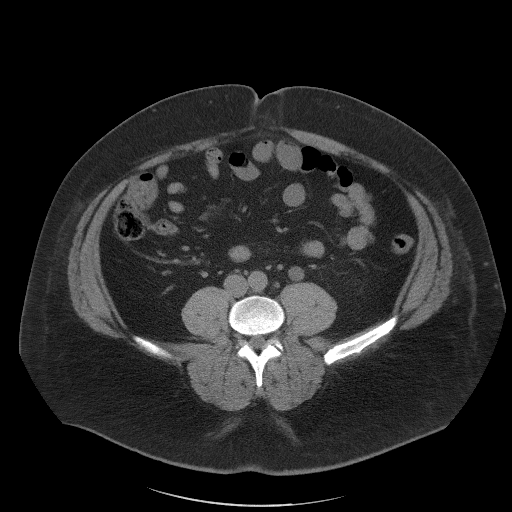
[im 62/107  soft-tissue]
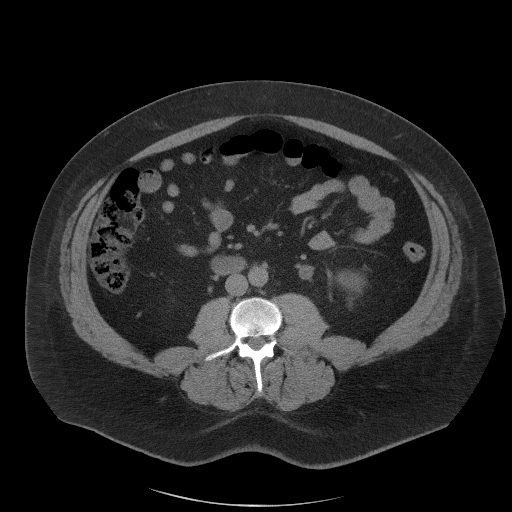
[im 71/107  soft-tissue]
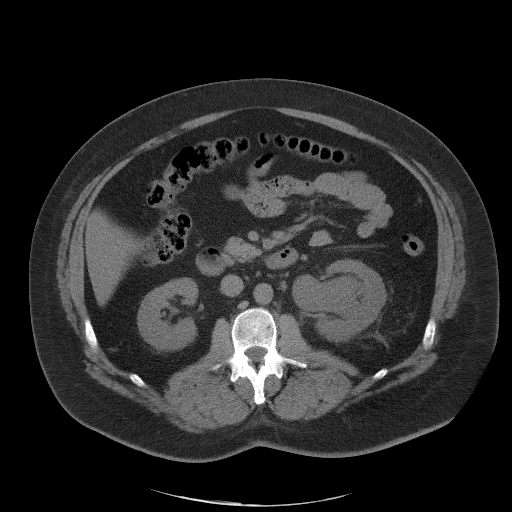
[im 71/107  bone]
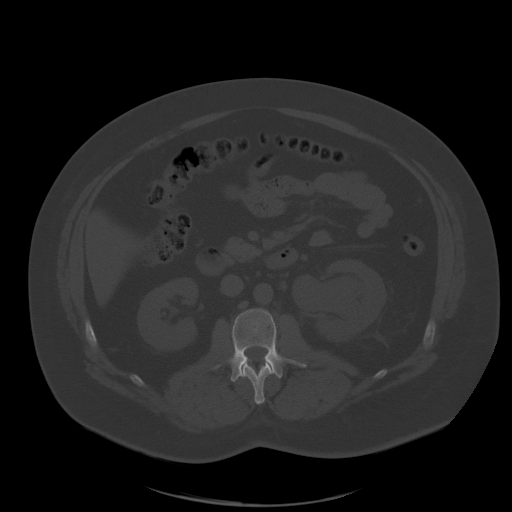
[im 76/107  soft-tissue]
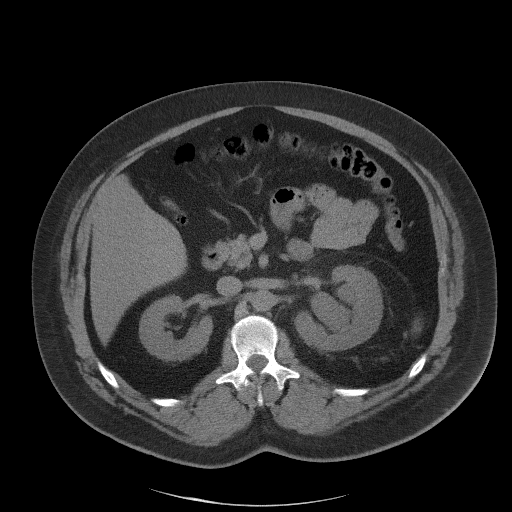
[im 84/107  soft-tissue]
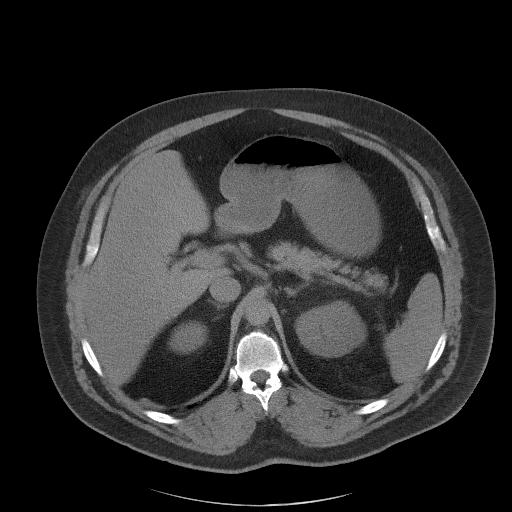
[im 93/107  soft-tissue]
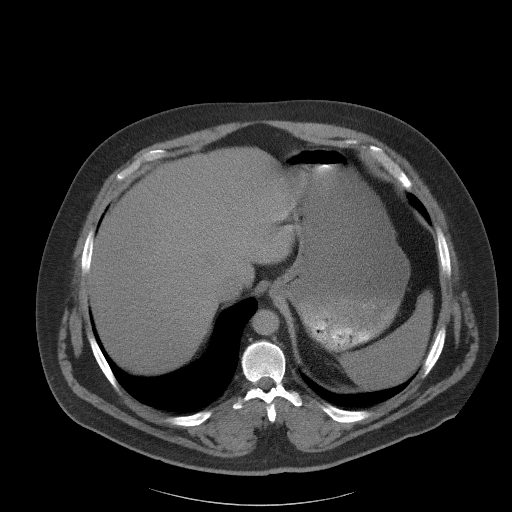
[im 102/107  soft-tissue]
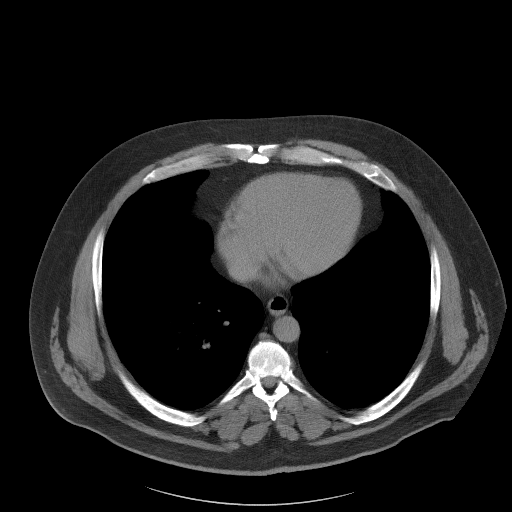

[Series 5: coronal st · coronal · 1.02mm/px · 3 of 116 slices shown]
[im 39/116  soft-tissue]
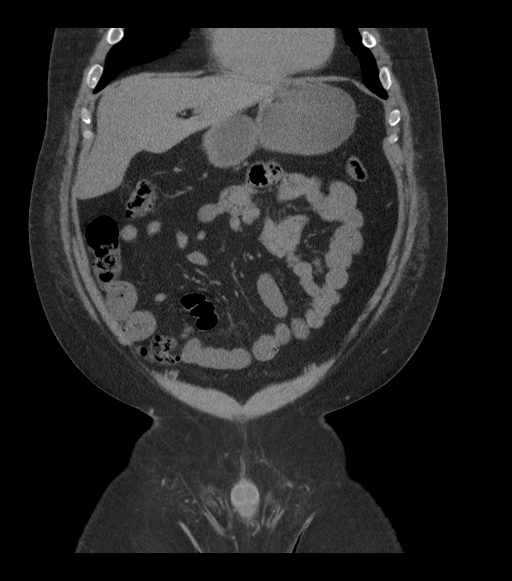
[im 52/116  soft-tissue]
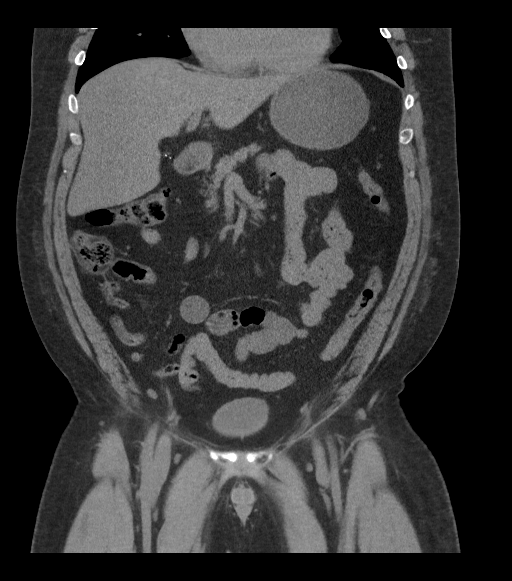
[im 64/116  soft-tissue]
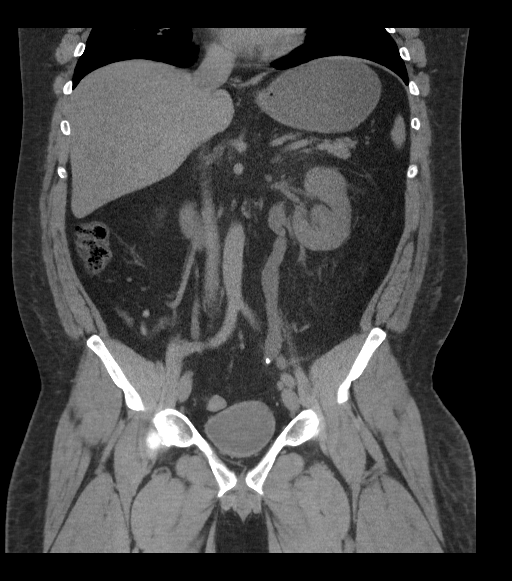

[16 of 46 positions shown; findings below may reference images not displayed]

FINDINGS: Lower chest: No acute abnormality.

Hepatobiliary: No focal liver abnormality is seen. Status post
cholecystectomy. No biliary dilatation.

Pancreas: Unremarkable. No pancreatic ductal dilatation or
surrounding inflammatory changes.

Spleen: Normal in size without focal abnormality.

Adrenals/Urinary Tract: Moderately severe left-sided hydronephrosis
and hydroureter. Calculus in the distal ureter roughly at the level
of S1-2 measures approximately 7 mm in greatest diameter. Some
additional punctate calculi in the lower left kidney. Cluster of
likely more than 1 calculus in the lower pole collecting system of
the right kidney measures approximately 9 mm in height and is not
causing obstruction. The bladder is unremarkable.

Stomach/Bowel: Bowel shows no evidence of obstruction. Some high
density material is identified throughout much of the appendix which
is not inflamed. No free air.

Vascular/Lymphatic: No significant vascular findings are present. No
enlarged abdominal or pelvic lymph nodes.

Reproductive: Prostate is unremarkable.

Other: Stable small umbilical hernia containing fat.

Musculoskeletal: No acute or significant osseous findings.
IMPRESSION: Moderately severe left hydronephrosis and hydroureter secondary to a
7 mm calculus located in the distal ureter at roughly the S1-2
level.
